# Patient Record
Sex: Female | Born: 1950 | Race: White | Hispanic: No | Marital: Single | Smoking: Current every day smoker
Health system: Southern US, Community
[De-identification: ages and names within clinical notes are randomized; demographics above are authoritative.]

---

## 2017-12-23 ENCOUNTER — Ambulatory Visit (INDEPENDENT_AMBULATORY_CARE_PROVIDER_SITE_OTHER): Payer: Medicare Other | Admitting: Podiatry

## 2017-12-23 ENCOUNTER — Encounter: Payer: Self-pay | Admitting: Podiatry

## 2017-12-23 VITALS — BP 113/76 | HR 92

## 2017-12-23 DIAGNOSIS — B351 Tinea unguium: Secondary | ICD-10-CM | POA: Diagnosis not present

## 2017-12-23 DIAGNOSIS — M79671 Pain in right foot: Secondary | ICD-10-CM | POA: Diagnosis not present

## 2017-12-23 DIAGNOSIS — L600 Ingrowing nail: Secondary | ICD-10-CM | POA: Diagnosis not present

## 2017-12-23 DIAGNOSIS — M79672 Pain in left foot: Secondary | ICD-10-CM | POA: Diagnosis not present

## 2017-12-23 NOTE — Patient Instructions (Signed)
Seen for hypertrophic ingrown nails. All nails debrided. Return in 3 months or sooner if needed.

## 2017-12-23 NOTE — Progress Notes (Signed)
SUBJECTIVE: 67 y.o. year old female presents complaining of painful nails. They are ingrown and hurts to walk. Patient uses a cane for ambulation. Recently moved from [place].  Review of Systems  Constitutional: Negative.   HENT: Negative.   Eyes: Negative.   Respiratory: Negative.   Cardiovascular:       Hx of CHF November 2018.  Gastrointestinal: Negative.   Musculoskeletal: Negative.   Skin: Negative.      OBJECTIVE: DERMATOLOGIC EXAMINATION: Painful ingrown hallucal nails bilateral. No open skin or drainage noted. Thick mycotic nails x 10.  VASCULAR EXAMINATION OF LOWER LIMBS: All pedal pulses are palpable with normal pulsation.  Capillary Filling times within 3 seconds in all digits.  No edema or erythema noted. Temperature gradient from tibial crest to dorsum of foot is within normal bilateral.  NEUROLOGIC EXAMINATION OF THE LOWER LIMBS: All epicritic and tactile sensations grossly intact. Sharp and Dull discriminatory sensations at the plantar ball of hallux is intact bilateral.   MUSCULOSKELETAL EXAMINATION: Rectus foot  ASSESSMENT: Onychocryptosis both great toes. Mycotic nails x 10. Pain in both feet.  PLAN: Reviewed findings and available options. Both feet soaked. All nails debrided.

## 2018-03-17 ENCOUNTER — Ambulatory Visit: Payer: Medicare Other | Admitting: Podiatry

## 2018-04-23 ENCOUNTER — Ambulatory Visit (INDEPENDENT_AMBULATORY_CARE_PROVIDER_SITE_OTHER): Payer: Medicare Other | Admitting: Physical Therapy

## 2018-04-23 ENCOUNTER — Encounter: Payer: Self-pay | Admitting: Physical Therapy

## 2018-04-23 DIAGNOSIS — R2681 Unsteadiness on feet: Secondary | ICD-10-CM

## 2018-04-23 DIAGNOSIS — M6281 Muscle weakness (generalized): Secondary | ICD-10-CM | POA: Diagnosis not present

## 2018-04-23 DIAGNOSIS — R2689 Other abnormalities of gait and mobility: Secondary | ICD-10-CM | POA: Diagnosis present

## 2018-04-23 NOTE — Therapy (Signed)
[place], [place], [address] Phone: [phone]   Fax:  [phone]  Physical Therapy Evaluation  Patient Details  Name: Brittney Cain MRN: [id number] Date of Birth: 25-Jan-1951 Referring Provider (PT): Chipper Oman, MD   Encounter Date: 04/23/2018  PT End of Session - 04/23/18 1504    Visit Number  1    Number of Visits  8    Date for PT Re-Evaluation  06/18/18    Authorization Type  Medicare    PT Start Time  0354    PT Stop Time  1450    PT Time Calculation (min)  48 min    Equipment Utilized During Treatment  Gait belt    Activity Tolerance  Patient tolerated treatment well    Behavior During Therapy  [facility] for tasks assessed/performed       History reviewed. No pertinent past medical history.  History reviewed. No pertinent surgical history.  There were no vitals filed for this visit.   Subjective Assessment - 04/23/18 1406    Subjective  Pt is a 67 y/o female who presents to OPPT with recent dx of progressive MS, but reports decline in function x 2 years.  Pt reports increase in tremors, decreased balance, and difficulty with mobility.      Pertinent History  chronic Lyme    Limitations  Walking    Diagnostic tests  MRI: brain clear; lesions on spinal cord    Patient Stated Goals  balance    Currently in Pain?  Yes    Pain Score  4    up to 7/10; at best 4/10   Pain Location  Back    Pain Orientation  Lower;Mid;Right;Left    Pain Descriptors / Indicators  Stabbing;Cramping    Pain Type  Chronic pain    Pain Radiating Towards  starts in low back then radiates distally, then up into mid back    Pain Onset  More than a month ago    Pain Frequency  Constant    Aggravating Factors   bending, walking    Pain Relieving Factors  sit, chiropractor   will monitor, however will not directly address        [facility] PT Assessment - 04/23/18 1412      Assessment   Medical  Diagnosis  Progressive MS    Referring Provider (PT)  Zeid, Meta Hatchet, MD    Onset Date/Surgical Date  --   decline x 2 years   Hand Dominance  Right    Next MD Visit  6 months    Prior Therapy  none      Precautions   Precautions  Fall      Restrictions   Weight Bearing Restrictions  No      Balance Screen   Has the patient fallen in the past 6 months  Yes    How many times?  1    Has the patient had a decrease in activity level because of a fear of falling?   No    Is the patient reluctant to leave their home because of a fear of falling?   No      Home Environment   Living Environment  Private residence    Living Arrangements  Alone    Type of [place] to enter    Entrance Stairs-Number of Steps  3    Entrance Stairs-Rails  Right  Home Layout  One level    [place] - single point   3 wheeled RW     Prior Function   Level of Independence  Independent with community mobility with device;Independent with basic ADLs    Vocation  Retired    Neurosurgeon    Leisure  spend time with sister, play with dogs; Tai Chi      Cognition   Overall Cognitive Status  Within Functional Limits for tasks assessed      Sensation   Additional Comments  intact      Posture/Postural Control   Posture/Postural Control  Postural limitations    Postural Limitations  Rounded Shoulders;Forward head      ROM / Strength   AROM / PROM / Strength  Strength      Strength   Overall Strength Comments  tested in sitting    Strength Assessment Site  Hip;Knee;Ankle    Right/Left Hip  Right;Left    Right Hip Flexion  3+/5    Left Hip Flexion  4/5    Right/Left Knee  Right;Left    Right Knee Flexion  3+/5    Right Knee Extension  4/5    Left Knee Flexion  4/5    Left Knee Extension  4/5    Right/Left Ankle  Right;Left    Right Ankle Dorsiflexion  4/5    Left Ankle Dorsiflexion  4/5      Flexibility   Soft Tissue Assessment  /Muscle Length  --   tightness bil lower extremities (HS, quads, gastrocs)     Ambulation/Gait   Ambulation/Gait  Yes    Ambulation/Gait Assistance  5: Supervision    Ambulation Distance (Feet)  100 Feet    Assistive device  Straight cane    Gait Pattern  Lateral trunk lean to right;Poor foot clearance - right;Trendelenburg    Ambulation Surface  Level;Indoor    Gait velocity  1.9 ft/sec   16' = 8.41 sec     Standardized Balance Assessment   Standardized Balance Assessment  Berg Balance Test;Timed Up and Go Test      Berg Balance Test   Sit to Stand  Able to stand without using hands and stabilize independently    Standing Unsupported  Able to stand safely 2 minutes    Sitting with Back Unsupported but Feet Supported on Floor or Stool  Able to sit safely and securely 2 minutes    Stand to Sit  Sits safely with minimal use of hands    Transfers  Able to transfer safely, minor use of hands    Standing Unsupported with Eyes Closed  Able to stand 10 seconds with supervision    Standing Ubsupported with Feet Together  Able to place feet together independently and stand 1 minute safely    From Standing, Reach Forward with Outstretched Arm  Can reach forward >12 cm safely (5")    From Standing Position, Pick up Object from Floor  Able to pick up shoe, needs supervision    From Standing Position, Turn to Look Behind Over each Shoulder  Needs supervision when turning    Turn 360 Degrees  Able to turn 360 degrees safely but slowly    Standing Unsupported, Alternately Place Feet on Step/Stool  Able to complete 4 steps without aid or supervision    Standing Unsupported, One Foot in Front  Able to plae foot ahead of the other independently and hold 30 seconds  Standing on One Leg  Tries to lift leg/unable to hold 3 seconds but remains standing independently    Total Score  42      Timed Up and Go Test   Normal TUG (seconds)  17.43    TUG Comments  with SPC                 Objective measurements completed on examination: See above findings.              PT Education - 04/23/18 1504    Education Details  initiated community resources, benefits of AD (info on [employer] for assistance)    Person(s) Educated  Patient    Methods  Explanation;Handout    Comprehension  Verbalized understanding       PT Short Term Goals - 04/23/18 1509      PT SHORT TERM GOAL #1   Title  verbalize understanding of community resources available for MS    Status  New    Target Date  05/21/18      PT SHORT TERM GOAL #2   Title  improve gait velocity to > 2.2 ft/sec for improved mobility    Status  New    Target Date  05/21/18        PT Long Term Goals - 04/23/18 1510      PT LONG TERM GOAL #1   Title  independent with HEP    Status  New    Target Date  06/18/18      PT LONG TERM GOAL #2   Title  improve gait velocity to >/= 2.62 ft/sec for improved community access    Status  New    Target Date  06/18/18      PT LONG TERM GOAL #3   Title  improve BERG balance score to >/= 45/56 for improved balance    Status  New    Target Date  06/18/18      PT LONG TERM GOAL #4   Title  improve timed up and go to < 14 sec for improved balance and mobility    Status  New    Target Date  06/18/18             Plan - 04/23/18 1505    Clinical Impression Statement  Pt is a 67 y/o female who presents to OPPT with new diagnosis of progressive MS.  Pt demonstrates decreased strength, balance, and gait abnormalities affecting functional mobility.  Pt will benefit from PT to address deficits listed.    History and Personal Factors relevant to plan of care:  chronic lyme, COPD, MS    Clinical Presentation  Evolving    Clinical Presentation due to:  new diagnoses    Clinical Decision Making  Moderate    Rehab Potential  Good    PT Frequency  1x / week   recommend 2x/wk but pt requesting 1x/wk   PT Duration  8 weeks    PT  Treatment/Interventions  ADLs/Self Care Home Management;Cryotherapy;Electrical Stimulation;Ultrasound;Moist Heat;Gait training;Stair training;Functional mobility training;DME Instruction;Therapeutic activities;Therapeutic exercise;Balance training;Neuromuscular re-education;Patient/family education;Energy conservation;Taping    PT Next Visit Plan  establish HEP for low level strengthening and balance/stretches to help with spasms    Consulted and Agree with Plan of Care  Patient       Patient will benefit from skilled therapeutic intervention in order to improve the following deficits and impairments:  Abnormal gait, Postural dysfunction, Decreased mobility, Decreased activity tolerance, Decreased endurance, Decreased strength,  Difficulty walking, Decreased balance, Cardiopulmonary status limiting activity  Visit Diagnosis: Other abnormalities of gait and mobility - Plan: PT plan of care cert/re-cert  Unsteadiness on feet - Plan: PT plan of care cert/re-cert  Muscle weakness (generalized) - Plan: PT plan of care cert/re-cert     Problem List There are no active problems to display for this patient.     Laureen Abrahams, PT, DPT 04/23/18 3:13 PM     [facility] [place], [place], [address] Phone: [phone]   Fax:  [phone]  Name: Ilithyia Titzer MRN: [id number] Date of Birth: 12-12-1950

## 2018-04-28 ENCOUNTER — Encounter: Payer: Self-pay | Admitting: Physical Therapy

## 2018-05-08 ENCOUNTER — Encounter: Payer: Self-pay | Admitting: Physical Therapy

## 2018-05-14 ENCOUNTER — Ambulatory Visit (INDEPENDENT_AMBULATORY_CARE_PROVIDER_SITE_OTHER): Payer: Medicare Other | Admitting: Physical Therapy

## 2018-05-14 ENCOUNTER — Encounter: Payer: Self-pay | Admitting: Physical Therapy

## 2018-05-14 DIAGNOSIS — M6281 Muscle weakness (generalized): Secondary | ICD-10-CM

## 2018-05-14 DIAGNOSIS — R2681 Unsteadiness on feet: Secondary | ICD-10-CM

## 2018-05-14 DIAGNOSIS — R2689 Other abnormalities of gait and mobility: Secondary | ICD-10-CM | POA: Diagnosis present

## 2018-05-14 NOTE — Therapy (Signed)
[place], [place], [address] Phone: [phone]   Fax:  [phone]  Physical Therapy Treatment  Patient Details  Name: Brittney Cain MRN: [id number] Date of Birth: October 14, 1950 Referring Provider (PT): Chipper Oman, MD   Encounter Date: 05/14/2018  PT End of Session - 05/14/18 1509    Visit Number  2    Number of Visits  8    Date for PT Re-Evaluation  06/18/18    Authorization Type  Medicare    PT Start Time  1427    PT Stop Time  1507    PT Time Calculation (min)  40 min    Activity Tolerance  Patient tolerated treatment well    Behavior During Therapy  [facility] for tasks assessed/performed       History reviewed. No pertinent past medical history.  History reviewed. No pertinent surgical history.  There were no vitals filed for this visit.  Subjective Assessment - 05/14/18 1428    Subjective  injured her Rt knee a few weeks ago; stood up and had sharp pain.  went to MD and has "torn cartlidge." feeling better but still achey.    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  Yes    Pain Score  --   would not rate   Pain Location  Back    Pain Orientation  Lower;Mid;Left;Right    Pain Descriptors / Indicators  Spasm                       [facility] Adult PT Treatment/Exercise - 05/14/18 1430      Self-Care   Self-Care  Other Self-Care Comments    Other Self-Care Comments   community resources for MS and financial assistance      Exercises   Exercises  Lumbar;Knee/Hip      Lumbar Exercises: Stretches   Single Knee to Chest Stretch  Right;Left;2 reps;30 seconds      Lumbar Exercises: Seated   Long Arc Quad on Chair  Both;10 reps    Sit to Stand  10 reps   min UE support     Lumbar Exercises: Supine   Bridge  10 reps;5 seconds      Knee/Hip Exercises: Aerobic   Nustep  L2 x 5 min; 4  extremities      Knee/Hip Exercises: Standing   Heel Raises  Both;10 reps    Knee Flexion  Both;10 reps   alternating, UE support   Hip Abduction  Both;10 reps;Knee straight   alternating, UE support   Hip Extension  Both;10 reps;Knee straight   alternating, UE support   SLS  3x15 sec bil; intermittent UE support             PT Education - 05/14/18 1508    Education Details  MS society community resources, HEP    Person(s) Educated  Patient    Methods  Explanation;Demonstration;Handout    Comprehension  Verbalized understanding;Returned demonstration;Need further instruction       PT Short Term Goals - 05/14/18 1511      PT SHORT TERM GOAL #1   Title  verbalize understanding of community resources available for MS    Status  Achieved      PT SHORT TERM GOAL #2   Title  improve gait velocity to > 2.2 ft/sec for improved mobility  Status  On-going    Target Date  05/21/18        PT Long Term Goals - 04/23/18 1510      PT LONG TERM GOAL #1   Title  independent with HEP    Status  New    Target Date  06/18/18      PT LONG TERM GOAL #2   Title  improve gait velocity to >/= 2.62 ft/sec for improved community access    Status  New    Target Date  06/18/18      PT LONG TERM GOAL #3   Title  improve BERG balance score to >/= 45/56 for improved balance    Status  New    Target Date  06/18/18      PT LONG TERM GOAL #4   Title  improve timed up and go to < 14 sec for improved balance and mobility    Status  New    Target Date  06/18/18            Plan - 05/14/18 1509    Clinical Impression Statement  Pt tolerated session well today with handouts provided from [employer] for financial assistance as well as fatigue and stretching.  STG #1 met.      Rehab Potential  Good    PT Frequency  1x / week   recommend 2x/wk but pt requesting 1x/wk   PT Duration  8 weeks    PT Treatment/Interventions  ADLs/Self Care Home Management;Cryotherapy;Electrical  Stimulation;Ultrasound;Moist Heat;Gait training;Stair training;Functional mobility training;DME Instruction;Therapeutic activities;Therapeutic exercise;Balance training;Neuromuscular re-education;Patient/family education;Energy conservation;Taping    PT Next Visit Plan  review HEP, continue strengthening/stretching and gentle endurance    PT Home Exercise Plan  Access Code: [email or web address]    Consulted and Agree with Plan of Care  Patient       Patient will benefit from skilled therapeutic intervention in order to improve the following deficits and impairments:  Abnormal gait, Postural dysfunction, Decreased mobility, Decreased activity tolerance, Decreased endurance, Decreased strength, Difficulty walking, Decreased balance, Cardiopulmonary status limiting activity  Visit Diagnosis: Other abnormalities of gait and mobility  Unsteadiness on feet  Muscle weakness (generalized)     Problem List There are no active problems to display for this patient.     Laureen Abrahams, PT, DPT 05/14/18 3:12 PM     [facility] [place], [place], [address] Phone: [phone]   Fax:  [phone]  Name: Brittney Cain MRN: [id number] Date of Birth: May 28, 1951

## 2018-05-14 NOTE — Patient Instructions (Signed)
Access Code: [facility]  URL: [email or web address]  Date: 05/14/2018  Prepared by: Moshe Cipro   Exercises  Hooklying Single Knee to Chest - 2 reps - 1 sets - 30 sec hold - 2x daily - 7x weekly  Supine Lower Trunk Rotation - 3 reps - 1 sets - 30 sec hold - 2x daily - 7x weekly  Supine Bridge - 10 reps - 1 sets - 5 sec hold - 1x daily - 7x weekly  Sit to Stand - 10 reps - 1 sets - 1x daily - 7x weekly  Seated Long Arc Quad - 10 reps - 1 sets - 3 sec hold - 1x daily - 7x weekly  Standing Hip Extension with Counter Support - 20 reps - 1 sets - 1x daily - 7x weekly  Standing Knee Flexion with Counter Support - 20 reps - 1 sets - 1x daily - 7x weekly  Standing Hip Abduction with Counter Support - 20 reps - 1 sets - 1x daily - 7x weekly

## 2018-05-22 ENCOUNTER — Encounter: Payer: Medicare Other | Admitting: Physical Therapy

## 2018-05-22 ENCOUNTER — Telehealth: Payer: Self-pay | Admitting: Physical Therapy

## 2018-05-22 NOTE — Telephone Encounter (Signed)
Called pt due to no show for 2:00 appt.  Pt thought appt was at 2:30, but also reported she was having trouble getting out of the house today.  Added new appt for next week.  Clarita CraneStephanie F Jerrion Tabbert, PT, DPT 05/22/18 2:42 PM

## 2018-05-29 ENCOUNTER — Encounter: Payer: Self-pay | Admitting: Physical Therapy

## 2018-05-29 ENCOUNTER — Ambulatory Visit (INDEPENDENT_AMBULATORY_CARE_PROVIDER_SITE_OTHER): Payer: Medicare Other | Admitting: Physical Therapy

## 2018-05-29 DIAGNOSIS — M6281 Muscle weakness (generalized): Secondary | ICD-10-CM | POA: Diagnosis not present

## 2018-05-29 DIAGNOSIS — R2689 Other abnormalities of gait and mobility: Secondary | ICD-10-CM | POA: Diagnosis not present

## 2018-05-29 DIAGNOSIS — R2681 Unsteadiness on feet: Secondary | ICD-10-CM | POA: Diagnosis not present

## 2018-05-29 NOTE — Therapy (Signed)
[facility] [address] [place], [place], [address] Phone: [phone]   Fax:  [phone]  Physical Therapy Treatment  Patient Details  Name: Brittney Cain MRN: [id number] Date of Birth: 12/03/1950 Referring Provider (PT): Aretha ParrotZeid, Nuhad Abou, MD   Encounter Date: 05/29/2018  PT End of Session - 05/29/18 1524    Visit Number  3    Number of Visits  8    Date for PT Re-Evaluation  06/18/18    Authorization Type  Medicare    PT Start Time  1445    PT Stop Time  1520    PT Time Calculation (min)  35 min    Activity Tolerance  Patient limited by fatigue    Behavior During Therapy  [facility] for tasks assessed/performed       History reviewed. No pertinent past medical history.  History reviewed. No pertinent surgical history.  There were no vitals filed for this visit.  Subjective Assessment - 05/29/18 1448    Subjective  not feeling well today.  back is hurting.  balance feels off when turning head.    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  Yes    Pain Score  6     Pain Location  Back    Pain Orientation  Lower;Mid;Left;Right    Pain Descriptors / Indicators  Spasm    Pain Type  Chronic pain    Pain Onset  More than a month ago    Aggravating Factors   bending, walking    Pain Relieving Factors  sit, chiropractor                       [facility] Adult PT Treatment/Exercise - 05/29/18 1451      Exercises   Exercises  Lumbar;Knee/Hip      Lumbar Exercises: Stretches   Single Knee to Chest Stretch  Right;Left;2 reps;30 seconds    Lower Trunk Rotation  3 reps;30 seconds      Knee/Hip Exercises: Standing   Heel Raises  Both;10 reps    Knee Flexion  Both;10 reps   alternating, UE support   Hip Abduction  Both;10 reps;Knee straight   alternating, UE support   Hip Extension  Both;10 reps;Knee straight   alternating, UE  support     Knee/Hip Exercises: Seated   Long Arc Quad  Both;10 reps    Ball Squeeze  10 x 5 sec    Clamshell with TheraBand  Blue   x10 reps   Marching  Both;10 reps   blue theraband   Sit to [employer]  10 reps;with UE support               PT Short Term Goals - 05/14/18 1511      PT SHORT TERM GOAL #1   Title  verbalize understanding of community resources available for MS    Status  Achieved      PT SHORT TERM GOAL #2   Title  improve gait velocity to > 2.2 ft/sec for improved mobility    Status  On-going    Target Date  05/21/18        PT Long Term Goals - 04/23/18 1510      PT LONG TERM GOAL #1   Title  independent with HEP    Status  New    Target Date  06/18/18  PT LONG TERM GOAL #2   Title  improve gait velocity to >/= 2.62 ft/sec for improved community access    Status  New    Target Date  06/18/18      PT LONG TERM GOAL #3   Title  improve BERG balance score to >/= 45/56 for improved balance    Status  New    Target Date  06/18/18      PT LONG TERM GOAL #4   Title  improve timed up and go to < 14 sec for improved balance and mobility    Status  New    Target Date  06/18/18            Plan - 05/29/18 1525    Clinical Impression Statement  Pt limited by episode of fatigue and tremor today needing ~ 5-7 min rest break and needed to end session early today.  Slowly progressing with PT at this time.  Progress limited by attendance at this time, but independent with current HEP.    Rehab Potential  Good    PT Frequency  1x / week   recommend 2x/wk but pt requesting 1x/wk   PT Duration  8 weeks    PT Treatment/Interventions  ADLs/Self Care Home Management;Cryotherapy;Electrical Stimulation;Ultrasound;Moist Heat;Gait training;Stair training;Functional mobility training;DME Instruction;Therapeutic activities;Therapeutic exercise;Balance training;Neuromuscular re-education;Patient/family education;Energy conservation;Taping    PT Next Visit Plan   review HEP, continue strengthening/stretching and gentle endurance    PT Home Exercise Plan  Access Code: [email or web address]    Consulted and Agree with Plan of Care  Patient       Patient will benefit from skilled therapeutic intervention in order to improve the following deficits and impairments:  Abnormal gait, Postural dysfunction, Decreased mobility, Decreased activity tolerance, Decreased endurance, Decreased strength, Difficulty walking, Decreased balance, Cardiopulmonary status limiting activity  Visit Diagnosis: Other abnormalities of gait and mobility  Unsteadiness on feet  Muscle weakness (generalized)     Problem List There are no active problems to display for this patient.     Clarita Crane, PT, DPT 05/29/18 3:26 PM     [facility] [address] [place], [place], [address] Phone: [phone]   Fax:  [phone]  Name: Brittney Cain MRN: [id number] Date of Birth: [birth year]

## 2018-06-11 ENCOUNTER — Encounter: Payer: Self-pay | Admitting: Physical Therapy

## 2018-06-11 ENCOUNTER — Ambulatory Visit (INDEPENDENT_AMBULATORY_CARE_PROVIDER_SITE_OTHER): Payer: Medicare Other | Admitting: Physical Therapy

## 2018-06-11 DIAGNOSIS — M6281 Muscle weakness (generalized): Secondary | ICD-10-CM

## 2018-06-11 DIAGNOSIS — R2689 Other abnormalities of gait and mobility: Secondary | ICD-10-CM

## 2018-06-11 DIAGNOSIS — R2681 Unsteadiness on feet: Secondary | ICD-10-CM | POA: Diagnosis not present

## 2018-06-11 NOTE — Therapy (Signed)
[place], [place], [address] Phone: [phone]   Fax:  [phone]  Physical Therapy Treatment  Patient Details  Name: Brittney Cain MRN: [id number] Date of Birth: 03/14/1951 Referring Provider (PT): Chipper Oman, MD   Encounter Date: 06/11/2018  PT End of Session - 06/11/18 1557    Visit Number  4    Number of Visits  8    Date for PT Re-Evaluation  06/18/18    Authorization Type  Medicare    PT Start Time  3202    PT Stop Time  1554    PT Time Calculation (min)  39 min    Activity Tolerance  Patient limited by fatigue    Behavior During Therapy  [facility] for tasks assessed/performed       History reviewed. No pertinent past medical history.  History reviewed. No pertinent surgical history.  There were no vitals filed for this visit.  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  Yes    Pain Score  4     Pain Location  --   Lt ankle, knee, back   Pain Orientation  Left    Pain Descriptors / Indicators  Spasm    Pain Type  Chronic pain    Pain Onset  More than a month ago    Pain Frequency  Constant    Aggravating Factors   bending, walking    Pain Relieving Factors  sit, chiropractor         [facility] PT Assessment - 06/11/18 1521      Assessment   Medical Diagnosis  Progressive MS    Referring Provider (PT)  Chipper Oman, MD      Ambulation/Gait   Gait velocity  1.63 ft/sec   17' = 10.42 sec                  [facility] Adult PT Treatment/Exercise - 06/11/18 1521      Knee/Hip Exercises: Aerobic   Nustep  L3 x 6 min; 4 extremities          Balance Exercises - 06/11/18 1534      Balance Exercises: Standing   Standing Eyes Opened  Solid surface;Narrow base of support  (BOS);Foam/compliant surface;Wide (BOA);Head turns    Standing Eyes Closed  Solid surface;Narrow base of support (BOS);5 reps;10 secs          PT Short Term Goals - 06/11/18 1557      PT SHORT TERM GOAL #1   Title  verbalize understanding of community resources available for MS    Status  Achieved      PT SHORT TERM GOAL #2   Title  improve gait velocity to > 2.2 ft/sec for improved mobility    Baseline  slight decrease    Status  Not Met        PT Long Term Goals - 04/23/18 1510      PT LONG TERM GOAL #1   Title  independent with HEP    Status  New    Target Date  06/18/18      PT LONG TERM GOAL #2   Title  improve gait velocity to >/= 2.62 ft/sec for improved community access  Status  New    Target Date  06/18/18      PT LONG TERM GOAL #3   Title  improve BERG balance score to >/= 45/56 for improved balance    Status  New    Target Date  06/18/18      PT LONG TERM GOAL #4   Title  improve timed up and go to < 14 sec for improved balance and mobility    Status  New    Target Date  06/18/18            Plan - 06/11/18 1557    Clinical Impression Statement  Pt tolerated session well today and added balance exercises to HEP today.  Pt feels she's mildly stronger since initial eval, and feels she's doing well with PT.  Wants to continue to attend PT 1x/wk to continue towards LTGs.  Will plan to assess and recert next visit.    Rehab Potential  Good    PT Frequency  1x / week   recommend 2x/wk but pt requesting 1x/wk   PT Duration  8 weeks    PT Treatment/Interventions  ADLs/Self Care Home Management;Cryotherapy;Electrical Stimulation;Ultrasound;Moist Heat;Gait training;Stair training;Functional mobility training;DME Instruction;Therapeutic activities;Therapeutic exercise;Balance training;Neuromuscular re-education;Patient/family education;Energy conservation;Taping    PT Next Visit Plan  review HEP, continue strengthening/stretching and gentle endurance; needs  recert    PT Home Exercise Plan  Access Code: [email or web address]    Consulted and Agree with Plan of Care  Patient       Patient will benefit from skilled therapeutic intervention in order to improve the following deficits and impairments:  Abnormal gait, Postural dysfunction, Decreased mobility, Decreased activity tolerance, Decreased endurance, Decreased strength, Difficulty walking, Decreased balance, Cardiopulmonary status limiting activity  Visit Diagnosis: Other abnormalities of gait and mobility  Unsteadiness on feet  Muscle weakness (generalized)     Problem List There are no active problems to display for this patient.     Laureen Abrahams, PT, DPT 06/11/18 4:00 PM      [facility] [place], [place], [address] Phone: [phone]   Fax:  [phone]  Name: Brittney Cain MRN: [id number] Date of Birth: June 28, 1951

## 2018-06-11 NOTE — Patient Instructions (Signed)
Access Code: [email or web address]  URL: [email or web address]  Date: 06/11/2018  Prepared by: Moshe CiproStephanie Antonio Creswell   Exercises  Hooklying Single Knee to Chest - 2 reps - 1 sets - 30 sec hold - 2x daily - 7x weekly  Supine Lower Trunk Rotation - 3 reps - 1 sets - 30 sec hold - 2x daily - 7x weekly  Supine Bridge - 10 reps - 1 sets - 5 sec hold - 1x daily - 7x weekly  Sit to Stand - 10 reps - 1 sets - 1x daily - 7x weekly  Seated Long Arc Quad - 10 reps - 1 sets - 3 sec hold - 1x daily - 7x weekly  Standing Hip Extension with Counter Support - 20 reps - 1 sets - 1x daily - 7x weekly  Standing Knee Flexion with Counter Support - 20 reps - 1 sets - 1x daily - 7x weekly  Standing Hip Abduction with Counter Support - 20 reps - 1 sets - 1x daily - 7x weekly  Romberg Stance with Head Nods - 10 reps - 1 sets - 1x daily - 7x weekly  Romberg Stance Eyes Closed on Foam Pad - 5 reps - 1 sets - 10-15 sec hold - 2x daily - 7x weekly

## 2018-06-18 ENCOUNTER — Encounter: Payer: Self-pay | Admitting: Physical Therapy

## 2018-06-18 ENCOUNTER — Ambulatory Visit (INDEPENDENT_AMBULATORY_CARE_PROVIDER_SITE_OTHER): Payer: Medicare Other | Admitting: Physical Therapy

## 2018-06-18 DIAGNOSIS — R2681 Unsteadiness on feet: Secondary | ICD-10-CM

## 2018-06-18 DIAGNOSIS — R2689 Other abnormalities of gait and mobility: Secondary | ICD-10-CM

## 2018-06-18 DIAGNOSIS — M6281 Muscle weakness (generalized): Secondary | ICD-10-CM | POA: Diagnosis not present

## 2018-06-18 NOTE — Therapy (Signed)
[place], [place], [address] Phone: [phone]   Fax:  [phone]  Physical Therapy Treatment  Patient Details  Name: Brittney Cain MRN: [id number] Date of Birth: 31-May-1951 Referring Provider (PT): Chipper Oman, MD   Encounter Date: 06/18/2018  PT End of Session - 06/18/18 1453    Visit Number  5    Number of Visits  9    Date for PT Re-Evaluation  07/16/18    Authorization Type  Medicare    PT Start Time  8242    PT Stop Time  1445    PT Time Calculation (min)  40 min    Activity Tolerance  Patient limited by fatigue;Patient tolerated treatment well    Behavior During Therapy  [facility] for tasks assessed/performed       History reviewed. No pertinent past medical history.  History reviewed. No pertinent surgical history.  There were no vitals filed for this visit.  Subjective Assessment - 06/18/18 1405    Subjective  "not so good today" feels tired and woozy, but willing to work.    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  Yes    Pain Score  5     Pain Location  Neck    Pain Orientation  Left    Pain Descriptors / Indicators  Spasm    Pain Type  Chronic pain    Pain Onset  More than a month ago    Pain Frequency  Constant    Aggravating Factors   bending, walking    Pain Relieving Factors  sit, chiropractor         [facility] PT Assessment - 06/18/18 1501      Assessment   Medical Diagnosis  Progressive MS    Referring Provider (PT)  Chipper Oman, MD      Ambulation/Gait   Gait velocity  1.63 ft/sec   17' = 10.42 sec                  [facility] Adult PT Treatment/Exercise - 06/18/18 1409      Exercises   Exercises  Shoulder;Neck;Elbow;Knee/Hip      Elbow Exercises   Elbow Flexion  Both;10 reps;Seated;Theraband    Theraband Level (Elbow Flexion)  Level 2 (Red)    Elbow Extension   Both;10 reps;Seated;Theraband    Theraband Level (Elbow Extension)  Level 2 (Red)      Neck Exercises: Seated   Shoulder Rolls  Backwards;10 reps      Lumbar Exercises: Stretches   Lower Trunk Rotation  3 reps;20 seconds   seated   Standing Extension Limitations  attempted with minimal benefit    Other Lumbar Stretch Exercise  seated mid back stretch with red physioball 3x10 sec each direction      Knee/Hip Exercises: Seated   Long Arc Quad  Both;10 reps;Weights    Long Arc Quad Weight  2 lbs.    Ball Squeeze  10 x 5 sec    Other Seated Knee/Hip Exercises  heel/toe raises x 10    Marching  Both;10 reps;Weights    Marching Weights  2 lbs.    Hamstring Curl  Both;10 reps   red theraband     Shoulder Exercises: Seated   Row  Both;10 reps;Theraband    Theraband Level (Shoulder Row)  Level 2 (Red)    Horizontal ABduction  Both;10 reps;Theraband    Theraband Level (Shoulder Horizontal ABduction)  Level 2 (Red)    External Rotation  Both;10 reps;Theraband    Theraband Level (Shoulder External Rotation)  Level 2 (Red)      Neck Exercises: Stretches   Upper Trapezius Stretch  Right;Left;3 reps;30 seconds    Levator Stretch  Right;Left;3 reps;30 seconds             PT Education - 06/18/18 1453    Education Details  HEP    Person(s) Educated  Patient    Methods  Explanation    Comprehension  Verbalized understanding       PT Short Term Goals - 06/11/18 1557      PT SHORT TERM GOAL #1   Title  verbalize understanding of community resources available for MS    Status  Achieved      PT SHORT TERM GOAL #2   Title  improve gait velocity to > 2.2 ft/sec for improved mobility    Baseline  slight decrease    Status  Not Met        PT Long Term Goals - 06/18/18 1456      PT LONG TERM GOAL #1   Title  independent with HEP    Status  On-going    Target Date  07/16/18      PT LONG TERM GOAL #2   Title  improve gait velocity to >/= 2.62 ft/sec for improved community  access    Status  On-going    Target Date  07/16/18      PT LONG TERM GOAL #3   Title  improve BERG balance score to >/= 45/56 for improved balance    Status  On-going    Target Date  07/16/18      PT LONG TERM GOAL #4   Title  improve timed up and go to < 14 sec for improved balance and mobility    Status  On-going    Target Date  07/16/18            Plan - 06/18/18 1456    Clinical Impression Statement  Pt arrived to PT today with c/o feeling weak and off balance.  Performed seated exercises today for strengthening and stretching.  Pt tolerated session well today.  Pt with inconsistent attendance initially with PT, with improvement in attendance x 2-3 weeks.  Recommend continued PT 1x/wk x 4 wks to progress towards LTGs.  Goals not met due to limited compliance at this time.    PT Frequency  1x / week    PT Duration  4 weeks    PT Treatment/Interventions  ADLs/Self Care Home Management;Cryotherapy;Electrical Stimulation;Ultrasound;Moist Heat;Gait training;Stair training;Functional mobility training;DME Instruction;Therapeutic activities;Therapeutic exercise;Balance training;Neuromuscular re-education;Patient/family education;Energy conservation;Taping    PT Next Visit Plan  review HEP, continue strengthening/stretching and gentle endurance    PT Home Exercise Plan  Access Code: [email or web address]    Consulted and Agree with Plan of Care  Patient       Patient will benefit from skilled therapeutic intervention in order to improve the following deficits and impairments:  Abnormal gait, Postural dysfunction, Decreased mobility, Decreased activity tolerance, Decreased endurance, Decreased strength, Difficulty walking, Decreased balance, Cardiopulmonary status limiting activity  Visit Diagnosis: Other abnormalities of gait and mobility - Plan: PT plan of care cert/re-cert  Unsteadiness on feet - Plan: PT plan of care cert/re-cert  Muscle weakness (generalized) - Plan: PT plan of care  cert/re-cert     Problem List There are  no active problems to display for this patient.     Laureen Abrahams, PT, DPT 06/18/18 3:02 PM     [facility] [place], [place], [address] Phone: [phone]   Fax:  [phone]  Name: Crickett Abbett MRN: [id number] Date of Birth: 01/26/1951

## 2018-06-18 NOTE — Patient Instructions (Signed)
[email or web address]  URL: [email or web address]  Date: 06/18/2018  Prepared by: Moshe CiproStephanie Tuleen Mandelbaum   Exercises  Hooklying Single Knee to Chest - 2 reps - 1 sets - 30 sec hold - 2x daily - 7x weekly  Supine Lower Trunk Rotation - 3 reps - 1 sets - 30 sec hold - 2x daily - 7x weekly  Supine Bridge - 10 reps - 1 sets - 5 sec hold - 1x daily - 7x weekly  Sit to Stand - 10 reps - 1 sets - 1x daily - 7x weekly  Seated Long Arc Quad - 10 reps - 1 sets - 3 sec hold - 1x daily - 7x weekly  Standing Hip Extension with Counter Support - 20 reps - 1 sets - 1x daily - 7x weekly  Standing Knee Flexion with Counter Support - 20 reps - 1 sets - 1x daily - 7x weekly  Standing Hip Abduction with Counter Support - 20 reps - 1 sets - 1x daily - 7x weekly  Romberg Stance with Head Nods - 10 reps - 1 sets - 1x daily - 7x weekly  Romberg Stance Eyes Closed on Foam Pad - 5 reps - 1 sets - 10-15 sec hold - 2x daily - 7x weekly  Seated Shoulder Horizontal Abduction with Resistance - 10 reps - 1 sets - 1x daily - 7x weekly  Seated Bilateral Shoulder External Rotation with Resistance - 10 reps - 1 sets - 1x daily - 7x weekly  Seated Elbow Flexion with Resistance - 10 reps - 1 sets - 1x daily - 7x weekly  Seated Elbow Extension with Self-Anchored Resistance - 10 reps - 1 sets - 1x daily - 7x weekly  Seated Scapular Retraction with Resistance - 10 reps - 1 sets - 1x daily - 7x weekly

## 2018-06-25 ENCOUNTER — Encounter: Payer: Medicare Other | Admitting: Physical Therapy

## 2018-07-07 ENCOUNTER — Telehealth: Payer: Self-pay | Admitting: Physical Therapy

## 2018-07-07 ENCOUNTER — Encounter: Payer: Medicare Other | Admitting: Physical Therapy

## 2018-07-07 NOTE — Telephone Encounter (Signed)
Left message for pt due to no show for PT appt.  Advised to call back to schedule more PT appts as this is last scheduled appt.  Clarita CraneStephanie F Bracken Moffa, PT, DPT 07/07/18 2:50 PM

## 2018-07-17 ENCOUNTER — Ambulatory Visit (INDEPENDENT_AMBULATORY_CARE_PROVIDER_SITE_OTHER): Payer: Medicare Other | Admitting: Physical Therapy

## 2018-07-17 ENCOUNTER — Encounter: Payer: Self-pay | Admitting: Physical Therapy

## 2018-07-17 DIAGNOSIS — R2681 Unsteadiness on feet: Secondary | ICD-10-CM | POA: Diagnosis not present

## 2018-07-17 DIAGNOSIS — R2689 Other abnormalities of gait and mobility: Secondary | ICD-10-CM | POA: Diagnosis present

## 2018-07-17 DIAGNOSIS — M6281 Muscle weakness (generalized): Secondary | ICD-10-CM

## 2018-07-17 NOTE — Patient Instructions (Signed)
Access Code: [facility]  URL: [email or web address]  Date: 07/17/2018  Prepared by: Moshe Cipro   Exercises  Hooklying Single Knee to Chest - 2 reps - 1 sets - 30 sec hold - 2x daily - 7x weekly  Supine Lower Trunk Rotation - 3 reps - 1 sets - 30 sec hold - 2x daily - 7x weekly  Supine Bridge - 10 reps - 1 sets - 5 sec hold - 1x daily - 7x weekly  Sit to Stand - 10 reps - 1 sets - 1x daily - 7x weekly  Seated Long Arc Quad - 10 reps - 1 sets - 3 sec hold - 1x daily - 7x weekly  Standing Hip Extension with Counter Support - 20 reps - 1 sets - 1x daily - 7x weekly  Standing Knee Flexion with Counter Support - 20 reps - 1 sets - 1x daily - 7x weekly  Standing Hip Abduction with Counter Support - 20 reps - 1 sets - 1x daily - 7x weekly  Romberg Stance with Head Nods - 10 reps - 1 sets - 1x daily - 7x weekly  Romberg Stance Eyes Closed on Foam Pad - 5 reps - 1 sets - 10-15 sec hold - 2x daily - 7x weekly  Seated Shoulder Horizontal Abduction with Resistance - 10 reps - 1 sets - 1x daily - 7x weekly  Seated Bilateral Shoulder External Rotation with Resistance - 10 reps - 1 sets - 1x daily - 7x weekly  Seated Elbow Flexion with Resistance - 10 reps - 1 sets - 1x daily - 7x weekly  Seated Elbow Extension with Self-Anchored Resistance - 10 reps - 1 sets - 1x daily - 7x weekly  Seated Scapular Retraction with Resistance - 10 reps - 1 sets - 1x daily - 7x weekly  Seated Gaze Stabilization with Head Nod - 1 sets - 30 Seconds - 2x daily - 7x weekly  Seated Gaze Stabilization with Head Rotation - 1 sets - 30 seconds - 2x daily - 7x weekly

## 2018-07-17 NOTE — Therapy (Signed)
[place], [place], [address] Phone: [phone]   Fax:  [phone]  Physical Therapy Treatment  Patient Details  Name: Brittney Cain MRN: [id number] Date of Birth: Sep 13, 1950 Referring Provider (PT): Chipper Oman, MD   Encounter Date: 07/17/2018  PT End of Session - 07/17/18 1547    Visit Number  6    Number of Visits  9    Date for PT Re-Evaluation  08/07/18   extended due to missed weeks   Authorization Type  Medicare    PT Start Time  3235    PT Stop Time  1526    PT Time Calculation (min)  39 min    Activity Tolerance  Patient limited by fatigue;Patient tolerated treatment well    Behavior During Therapy  [facility] for tasks assessed/performed       History reviewed. No pertinent past medical history.  History reviewed. No pertinent surgical history.  There were no vitals filed for this visit.  Subjective Assessment - 07/17/18 1451    Subjective  had bad tremors over the holiday which made her very sore.  "I'm still sore." still having cycles of tremors.    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  Yes    Pain Location  Generalized   back, neck, knees, shoulder   Pain Orientation  Right;Left    Pain Descriptors / Indicators  Sore;Spasm    Pain Type  Chronic pain    Pain Onset  More than a month ago    Pain Frequency  Constant    Aggravating Factors   walking, standing    Pain Relieving Factors  sit, chiropractor                       [facility] Adult PT Treatment/Exercise - 07/17/18 1506      Knee/Hip Exercises: Aerobic   Nustep  L3 x 6 min; 4 extremities      Vestibular Treatment/Exercise - 07/17/18 1539      Vestibular Treatment/Exercise   Vestibular Treatment Provided  Gaze    Gaze Exercises  X1 Viewing Horizontal;X1 Viewing Vertical      X1 Viewing Horizontal   Foot Position   seated    Time  --   30 sec   Reps  1    Comments  min cues with mild increase in sypmtoms      X1 Viewing Vertical   Foot Position  seated    Time  --   10 sec   Reps  1    Comments  attempted 30 sec; pt unable to tolerate with increased nausea         Balance Exercises - 07/17/18 1501      OTAGO PROGRAM   Head Movements  Sitting;5 reps    Neck Movements  Sitting;5 reps    Back Extension  Sitting;5 reps    Trunk Movements  Sitting;5 reps    Ankle Movements  Sitting;10 reps    Knee Extensor  10 reps    Ankle Plantorflexors  20 reps, support   10 reps   Ankle Dorsiflexors  20 reps, support   10 reps   Knee Bends  10 reps, support    Backwards Walking  Support    Sideways Walking  Assistive device    Tandem Stance  10 seconds, support  Tandem Walk  Support        PT Education - 07/17/18 1541    Education Details  gaze stab    Person(s) Educated  Patient    Methods  Explanation;Demonstration;Handout    Comprehension  Verbalized understanding;Returned demonstration       PT Short Term Goals - 06/11/18 1557      PT SHORT TERM GOAL #1   Title  verbalize understanding of community resources available for MS    Status  Achieved      PT SHORT TERM GOAL #2   Title  improve gait velocity to > 2.2 ft/sec for improved mobility    Baseline  slight decrease    Status  Not Met        PT Long Term Goals - 07/17/18 1552      PT LONG TERM GOAL #1   Title  independent with HEP    Status  On-going    Target Date  08/07/18      PT LONG TERM GOAL #2   Title  improve gait velocity to >/= 2.62 ft/sec for improved community access    Status  On-going    Target Date  08/07/18      PT LONG TERM GOAL #3   Title  improve BERG balance score to >/= 45/56 for improved balance    Status  On-going    Target Date  08/07/18      PT LONG TERM GOAL #4   Title  improve timed up and go to < 14 sec for improved balance and mobility    Status  On-going    Target Date  08/07/18             Plan - 07/17/18 1552    Clinical Impression Statement  Pt returns after missing 3 weeks of PT due to holidays and other appt conflicts.  Pt continues to demonstrate decreased endurance and activity tolerance needing frequent rest breaks.  Pt reported dizziness with head turns today so provided gaze stabilization to HEP.  Will continue to benefit from PT to address deficits listed.    PT Frequency  1x / week    PT Duration  4 weeks    PT Treatment/Interventions  ADLs/Self Care Home Management;Cryotherapy;Electrical Stimulation;Ultrasound;Moist Heat;Gait training;Stair training;Functional mobility training;DME Instruction;Therapeutic activities;Therapeutic exercise;Balance training;Neuromuscular re-education;Patient/family education;Energy conservation;Taping    PT Next Visit Plan  continue strengthening/stretching and gentle endurance, balance and gaze activities    PT Home Exercise Plan  Access Code: [email or web address]    Consulted and Agree with Plan of Care  Patient       Patient will benefit from skilled therapeutic intervention in order to improve the following deficits and impairments:  Abnormal gait, Postural dysfunction, Decreased mobility, Decreased activity tolerance, Decreased endurance, Decreased strength, Difficulty walking, Decreased balance, Cardiopulmonary status limiting activity  Visit Diagnosis: Other abnormalities of gait and mobility  Unsteadiness on feet  Muscle weakness (generalized)     Problem List There are no active problems to display for this patient.     Laureen Abrahams, PT, DPT 07/17/18 3:55 PM     [facility] [place], [place], [address] Phone: [phone]   Fax:  [phone]  Name: Brittney Cain MRN: [id number] Date of Birth: 1951/02/05

## 2018-07-24 ENCOUNTER — Encounter: Payer: Self-pay | Admitting: Physical Therapy

## 2018-07-24 ENCOUNTER — Ambulatory Visit (INDEPENDENT_AMBULATORY_CARE_PROVIDER_SITE_OTHER): Payer: Medicare Other | Admitting: Physical Therapy

## 2018-07-24 DIAGNOSIS — R2689 Other abnormalities of gait and mobility: Secondary | ICD-10-CM

## 2018-07-24 DIAGNOSIS — R2681 Unsteadiness on feet: Secondary | ICD-10-CM | POA: Diagnosis not present

## 2018-07-24 DIAGNOSIS — M6281 Muscle weakness (generalized): Secondary | ICD-10-CM | POA: Diagnosis not present

## 2018-07-24 NOTE — Therapy (Signed)
[place], [place], [address] Phone: [phone]   Fax:  [phone]  Physical Therapy Treatment  Patient Details  Name: Brittney Cain MRN: [id number] Date of Birth: September 25, 1950 Referring Provider (PT): Chipper Oman, MD   Encounter Date: 07/24/2018  PT End of Session - 07/24/18 1603    Visit Number  7    Number of Visits  9    Date for PT Re-Evaluation  08/07/18   extended due to missed weeks   Authorization Type  Medicare    PT Start Time  1503    PT Stop Time  1546    PT Time Calculation (min)  43 min    Activity Tolerance  Patient limited by fatigue;Patient tolerated treatment well    Behavior During Therapy  [facility] for tasks assessed/performed       History reviewed. No pertinent past medical history.  History reviewed. No pertinent surgical history.  There were no vitals filed for this visit.  Subjective Assessment - 07/24/18 1507    Subjective  feeling "lousy" today.  stopped side stepping exercise at home due to increase in sciatica flare up.  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  Yes    Pain Score  6    neck, back, hips   Pain Location  Generalized    Pain Orientation  Right;Left    Pain Descriptors / Indicators  Sore;Spasm    Pain Type  Chronic pain    Pain Onset  More than a month ago    Pain Frequency  Constant    Aggravating Factors   walking, standing    Pain Relieving Factors  sit, chiropractor                       [facility] Adult PT Treatment/Exercise - 07/24/18 1509      Lumbar Exercises: Aerobic   Nustep  L4 x 8 min; PT present to discuss progress      Manual Therapy   Manual Therapy  Soft tissue mobilization    Manual therapy comments  skilled palpation and monitoring of  soft tissue during DN    Soft tissue mobilization  Lt upper trap, levator scapula, scalenes, cervical paraspinals      Neck Exercises: Stretches   Upper Trapezius Stretch  Right;Left;3 reps;30 seconds    Other Neck Stretches  attempted scalene stretch; unable due to limited neck ROM       Trigger Point Dry Needling - 07/24/18 1602    Consent Given?  Yes    Education Handout Provided  Yes   will provide next session, verbally reviewed   Muscles Treated Upper Body  Upper trapezius;Supraspinatus    Upper Trapezius Response  Twitch reponse elicited;Palpable increased muscle length    Supraspinatus Response  Twitch response elicited;Palpable increased muscle length      Moist heat applied to cervical area during nustep.     PT Education - 07/24/18 1602    Education Details  DN    Person(s) Educated  Patient    Methods  Explanation   will provide handout next visit   Comprehension  Verbalized understanding       PT Short Term Goals - 06/11/18 1557  PT SHORT TERM GOAL #1   Title  verbalize understanding of community resources available for MS    Status  Achieved      PT SHORT TERM GOAL #2   Title  improve gait velocity to > 2.2 ft/sec for improved mobility    Baseline  slight decrease    Status  Not Met        PT Long Term Goals - 07/17/18 1552      PT LONG TERM GOAL #1   Title  independent with HEP    Status  On-going    Target Date  08/07/18      PT LONG TERM GOAL #2   Title  improve gait velocity to >/= 2.62 ft/sec for improved community access    Status  On-going    Target Date  08/07/18      PT LONG TERM GOAL #3   Title  improve BERG balance score to >/= 45/56 for improved balance    Status  On-going    Target Date  08/07/18      PT LONG TERM GOAL #4   Title  improve timed up and go to < 14 sec for improved balance and mobility    Status  On-going    Target Date  08/07/18            Plan - 07/24/18 1603    Clinical Impression Statement  Pt  with positive response to DN and manual therapy today with decreased tightness following session.  Discussed possibility of DN biweekly to help with tissue extensibility and decrease pain.  Will continue to benefit from PT to maximize function.    PT Frequency  1x / week    PT Duration  4 weeks    PT Treatment/Interventions  ADLs/Self Care Home Management;Cryotherapy;Electrical Stimulation;Ultrasound;Moist Heat;Gait training;Stair training;Functional mobility training;DME Instruction;Therapeutic activities;Therapeutic exercise;Balance training;Neuromuscular re-education;Patient/family education;Energy conservation;Taping    PT Next Visit Plan  continue strengthening/stretching and gentle endurance, balance and gaze activities    PT Home Exercise Plan  Access Code: [email or web address]    Consulted and Agree with Plan of Care  Patient       Patient will benefit from skilled therapeutic intervention in order to improve the following deficits and impairments:  Abnormal gait, Postural dysfunction, Decreased mobility, Decreased activity tolerance, Decreased endurance, Decreased strength, Difficulty walking, Decreased balance, Cardiopulmonary status limiting activity  Visit Diagnosis: Other abnormalities of gait and mobility  Unsteadiness on feet  Muscle weakness (generalized)     Problem List There are no active problems to display for this patient.     Laureen Abrahams, PT, DPT 07/24/18 4:05 PM     [facility] [place], [place], [address] Phone: [phone]   Fax:  [phone]  Name: Johnny Latu MRN: [id number] Date of Birth: 24-Oct-1950

## 2018-07-30 ENCOUNTER — Encounter: Payer: Medicare Other | Admitting: Physical Therapy

## 2018-08-04 ENCOUNTER — Encounter: Payer: Medicare Other | Admitting: Physical Therapy

## 2018-08-05 ENCOUNTER — Telehealth: Payer: Self-pay | Admitting: Physical Therapy

## 2018-08-05 NOTE — Telephone Encounter (Signed)
Left message for pt due to no show.  No more scheduled appts at this time.  Clarita Crane, PT, DPT 08/05/18 8:02 AM

## 2018-08-28 ENCOUNTER — Ambulatory Visit (INDEPENDENT_AMBULATORY_CARE_PROVIDER_SITE_OTHER): Payer: Medicare Other | Admitting: Physical Therapy

## 2018-08-28 ENCOUNTER — Encounter: Payer: Self-pay | Admitting: Physical Therapy

## 2018-08-28 DIAGNOSIS — R2689 Other abnormalities of gait and mobility: Secondary | ICD-10-CM | POA: Diagnosis present

## 2018-08-28 DIAGNOSIS — R2681 Unsteadiness on feet: Secondary | ICD-10-CM | POA: Diagnosis not present

## 2018-08-28 DIAGNOSIS — M6281 Muscle weakness (generalized): Secondary | ICD-10-CM | POA: Diagnosis not present

## 2018-08-28 NOTE — Therapy (Signed)
[place], [place], [address] Phone: [phone]   Fax:  [phone]  Physical Therapy Treatment/Discharge/Recert  Patient Details  Name: Brittney Cain MRN: [id number] Date of Birth: 12-10-1950 Referring Provider (PT): Chipper Oman, MD   Encounter Date: 08/28/2018  PT End of Session - 08/28/18 1525    Visit Number  8    Number of Visits  9    Date for PT Re-Evaluation  08/07/18   extended due to missed weeks   Authorization Type  Medicare    PT Start Time  7616    PT Stop Time  1526    PT Time Calculation (min)  30 min    Activity Tolerance  Patient limited by fatigue;Patient tolerated treatment well    Behavior During Therapy  [facility] for tasks assessed/performed       History reviewed. No pertinent past medical history.  History reviewed. No pertinent surgical history.  There were no vitals filed for this visit.  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    Patient Stated Goals  balance    Pain Onset  More than a month ago         [facility] PT Assessment - 08/28/18 1459      Assessment   Medical Diagnosis  Progressive MS    Referring Provider (PT)  Arvella Nigh, Meta Hatchet, MD      Ambulation/Gait   Gait velocity  1.34 ft/sec   13.41 sec = 18'     Berg Balance Test   Sit to Stand  Able to stand without using hands and stabilize independently    Standing Unsupported  Able to stand safely 2 minutes    Sitting with Back Unsupported but Feet Supported on Floor or Stool  Able to sit safely and securely 2 minutes    Stand to Sit  Sits safely with minimal use of hands    Transfers  Able to transfer safely, minor use of hands    Standing Unsupported with Eyes Closed  Able to stand 10 seconds safely    Standing Ubsupported with Feet Together  Able to  place feet together independently and stand 1 minute safely    From Standing, Reach Forward with Outstretched Arm  Can reach confidently >25 cm (10")    From Standing Position, Pick up Object from Floor  Able to pick up shoe safely and easily    From Standing Position, Turn to Look Behind Over each Shoulder  Looks behind from both sides and weight shifts well    Turn 360 Degrees  Able to turn 360 degrees safely but slowly    Standing Unsupported, Alternately Place Feet on Step/Stool  Able to stand independently and safely and complete 8 steps in 20 seconds    Standing Unsupported, One Foot in Front  Able to plae foot ahead of the other independently and hold 30 seconds    Standing on One Leg  Able to lift leg independently and hold > 10 seconds    Total Score  53      Timed Up and Go Test   Normal TUG (seconds)  16.74                   [facility] Adult PT Treatment/Exercise - 08/28/18 1459  Self-Care   Self-Care  ADL's    ADL's  use of sock aid and long handled shoe horn to help with ADLs    Other Self-Care Comments   discussed PT's role in MS and how to assist and best serve patient.  Pt agreeable to d/c today to continue with HEP and return PRN for PT             PT Education - 08/28/18 1525    Education Details  ADLs and adaptive equipment    Person(s) Educated  Patient    Methods  Explanation    Comprehension  Verbalized understanding       PT Short Term Goals - 08/28/18 1526      PT SHORT TERM GOAL #1   Title  verbalize understanding of community resources available for MS    Status  Achieved      PT SHORT TERM GOAL #2   Title  improve gait velocity to > 2.2 ft/sec for improved mobility    Baseline  slight decrease    Status  Not Met        PT Long Term Goals - 08/28/18 1526      PT LONG TERM GOAL #1   Title  independent with HEP    Status  Achieved      PT LONG TERM GOAL #2   Title  improve gait velocity to >/= 2.62 ft/sec for improved  community access    Status  Not Met      PT LONG TERM GOAL #3   Title  improve BERG balance score to >/= 45/56 for improved balance    Status  Achieved      PT LONG TERM GOAL #4   Title  improve timed up and go to < 14 sec for improved balance and mobility    Status  Not Met            Plan - 08/28/18 1526    Clinical Impression Statement  Pt has met 2/4 LTGs and agreeable to d/c from PT at this time to continue with home exercises and community fitness.    PT Frequency  1x / week    PT Duration  4 weeks    PT Treatment/Interventions  ADLs/Self Care Home Management;Cryotherapy;Electrical Stimulation;Ultrasound;Moist Heat;Gait training;Stair training;Functional mobility training;DME Instruction;Therapeutic activities;Therapeutic exercise;Balance training;Neuromuscular re-education;Patient/family education;Energy conservation;Taping    PT Next Visit Plan  d/c PT    PT Home Exercise Plan  Access Code: [email or web address]    Consulted and Agree with Plan of Care  Patient       Patient will benefit from skilled therapeutic intervention in order to improve the following deficits and impairments:  Abnormal gait, Postural dysfunction, Decreased mobility, Decreased activity tolerance, Decreased endurance, Decreased strength, Difficulty walking, Decreased balance, Cardiopulmonary status limiting activity  Visit Diagnosis: Other abnormalities of gait and mobility - Plan: PT plan of care cert/re-cert  Unsteadiness on feet - Plan: PT plan of care cert/re-cert  Muscle weakness (generalized) - Plan: PT plan of care cert/re-cert     Problem List There are no active problems to display for this patient.     Laureen Abrahams, PT, DPT 08/28/18 3:29 PM    [facility] [place], [place], [address] Phone: [phone]   Fax:  [phone]  Name: Brittney Cain MRN: [id number] Date of Birth: [birth year]  PHYSICAL  THERAPY DISCHARGE SUMMARY  Visits from Start of Care: 8  Current functional level related  to goals / functional outcomes: See above   Remaining deficits: See above   Education / Equipment: HEP  Plan: Patient agrees to discharge.  Patient goals were partially met. Patient is being discharged due to being pleased with the current functional level.  ?????     Laureen Abrahams, PT, DPT 08/28/18 3:29 PM  [facility] [place], [place] [address]  [phone] (office) [phone] (fax)

## 2020-01-05 ENCOUNTER — Other Ambulatory Visit (HOSPITAL_COMMUNITY): Payer: Medicare Other

## 2020-01-05 ENCOUNTER — Inpatient Hospital Stay
Admission: RE | Admit: 2020-01-05 | Discharge: 2020-01-14 | Disposition: A | Discharge status: Rehabilitation Facility | Payer: Medicare Other | Source: Other Acute Inpatient Hospital | Attending: Internal Medicine | Admitting: Internal Medicine

## 2020-01-05 DIAGNOSIS — Z4659 Encounter for fitting and adjustment of other gastrointestinal appliance and device: Secondary | ICD-10-CM

## 2020-01-05 DIAGNOSIS — T797XXD Traumatic subcutaneous emphysema, subsequent encounter: Secondary | ICD-10-CM

## 2020-01-05 DIAGNOSIS — J939 Pneumothorax, unspecified: Secondary | ICD-10-CM

## 2020-01-06 ENCOUNTER — Other Ambulatory Visit (HOSPITAL_COMMUNITY): Payer: Medicare Other

## 2020-01-06 LAB — COMPREHENSIVE METABOLIC PANEL
ALT: 36 U/L (ref 0–44)
AST: 29 U/L (ref 15–41)
Albumin: 2.8 g/dL — ABNORMAL LOW (ref 3.5–5.0)
Alkaline Phosphatase: 108 U/L (ref 38–126)
Anion gap: 8 (ref 5–15)
BUN: 17 mg/dL (ref 8–23)
CO2: 33 mmol/L — ABNORMAL HIGH (ref 22–32)
Calcium: 8.8 mg/dL — ABNORMAL LOW (ref 8.9–10.3)
Chloride: 97 mmol/L — ABNORMAL LOW (ref 98–111)
Creatinine, Ser: 0.64 mg/dL (ref 0.44–1.00)
GFR calc Af Amer: >60 mL/min (ref >60)
GFR calc non Af Amer: >60 mL/min (ref >60)
Glucose, Bld: 115 mg/dL — ABNORMAL HIGH (ref 70–99)
Potassium: 3.7 mmol/L (ref 3.5–5.1)
Sodium: 138 mmol/L (ref 135–145)
Total Bilirubin: 0.5 mg/dL (ref 0.3–1.2)
Total Protein: 6 g/dL — ABNORMAL LOW (ref 6.5–8.1)

## 2020-01-06 LAB — CBC
HCT: 25.6 % — ABNORMAL LOW (ref 36.0–46.0)
Hemoglobin: 8 g/dL — ABNORMAL LOW (ref 12.0–15.0)
MCH: 31.3 pg (ref 26.0–34.0)
MCHC: 31.3 g/dL (ref 30.0–36.0)
MCV: 100 fL (ref 80.0–100.0)
Platelets: 476 10*3/uL — ABNORMAL HIGH (ref 150–400)
RBC: 2.56 MIL/uL — ABNORMAL LOW (ref 3.87–5.11)
RDW: 18.1 % — ABNORMAL HIGH (ref 11.5–15.5)
WBC: 11.4 10*3/uL — ABNORMAL HIGH (ref 4.0–10.5)
nRBC: 0 % (ref 0.0–0.2)

## 2020-01-06 LAB — URINALYSIS, ROUTINE W REFLEX MICROSCOPIC
Bacteria, UA: NONE SEEN
Bilirubin Urine: NEGATIVE
Glucose, UA: NEGATIVE mg/dL
Ketones, ur: NEGATIVE mg/dL
Leukocytes,Ua: NEGATIVE
Nitrite: NEGATIVE
Protein, ur: NEGATIVE mg/dL
Specific Gravity, Urine: 1.011 (ref 1.005–1.030)
pH: 8 (ref 5.0–8.0)

## 2020-01-06 LAB — TSH: TSH: 1.359 u[IU]/mL (ref 0.350–4.500)

## 2020-01-06 LAB — BLOOD GAS, ARTERIAL
Acid-Base Excess: 8.2 mmol/L — ABNORMAL HIGH (ref 0.0–2.0)
Bicarbonate: 33.7 mmol/L — ABNORMAL HIGH (ref 20.0–28.0)
FIO2: 28
O2 Saturation: 92.7 %
Patient temperature: 36.6
pCO2 arterial: 60.2 mmHg — ABNORMAL HIGH (ref 32.0–48.0)
pH, Arterial: 7.364 (ref 7.350–7.450)
pO2, Arterial: 65.9 mmHg — ABNORMAL LOW (ref 83.0–108.0)

## 2020-01-07 ENCOUNTER — Other Ambulatory Visit (HOSPITAL_COMMUNITY): Payer: Medicare Other

## 2020-01-07 LAB — PHOSPHORUS: Phosphorus: 3.5 mg/dL (ref 2.5–4.6)

## 2020-01-07 LAB — VITAMIN B12: Vitamin B-12: 675 pg/mL (ref 180–914)

## 2020-01-07 LAB — FOLATE: Folate: 13.7 ng/mL (ref >5.9)

## 2020-01-07 LAB — MAGNESIUM: Magnesium: 2.2 mg/dL (ref 1.7–2.4)

## 2020-01-08 ENCOUNTER — Other Ambulatory Visit (HOSPITAL_COMMUNITY): Payer: Medicare Other

## 2020-01-09 ENCOUNTER — Other Ambulatory Visit (HOSPITAL_COMMUNITY): Payer: Medicare Other

## 2020-01-11 LAB — BASIC METABOLIC PANEL
Anion gap: 9 (ref 5–15)
BUN: 20 mg/dL (ref 8–23)
CO2: 31 mmol/L (ref 22–32)
Calcium: 8.6 mg/dL — ABNORMAL LOW (ref 8.9–10.3)
Chloride: 97 mmol/L — ABNORMAL LOW (ref 98–111)
Creatinine, Ser: 0.76 mg/dL (ref 0.44–1.00)
GFR calc Af Amer: >60 mL/min (ref >60)
GFR calc non Af Amer: >60 mL/min (ref >60)
Glucose, Bld: 145 mg/dL — ABNORMAL HIGH (ref 70–99)
Potassium: 3.6 mmol/L (ref 3.5–5.1)
Sodium: 137 mmol/L (ref 135–145)

## 2020-01-11 LAB — MAGNESIUM: Magnesium: 1.9 mg/dL (ref 1.7–2.4)

## 2020-01-11 LAB — CBC
HCT: 24 % — ABNORMAL LOW (ref 36.0–46.0)
Hemoglobin: 7.5 g/dL — ABNORMAL LOW (ref 12.0–15.0)
MCH: 31.8 pg (ref 26.0–34.0)
MCHC: 31.3 g/dL (ref 30.0–36.0)
MCV: 101.7 fL — ABNORMAL HIGH (ref 80.0–100.0)
Platelets: 481 10*3/uL — ABNORMAL HIGH (ref 150–400)
RBC: 2.36 MIL/uL — ABNORMAL LOW (ref 3.87–5.11)
RDW: 18.6 % — ABNORMAL HIGH (ref 11.5–15.5)
WBC: 11.7 10*3/uL — ABNORMAL HIGH (ref 4.0–10.5)
nRBC: 0 % (ref 0.0–0.2)

## 2020-01-13 LAB — CBC
HCT: 26.1 % — ABNORMAL LOW (ref 36.0–46.0)
Hemoglobin: 8 g/dL — ABNORMAL LOW (ref 12.0–15.0)
MCH: 31.1 pg (ref 26.0–34.0)
MCHC: 30.7 g/dL (ref 30.0–36.0)
MCV: 101.6 fL — ABNORMAL HIGH (ref 80.0–100.0)
Platelets: 466 10*3/uL — ABNORMAL HIGH (ref 150–400)
RBC: 2.57 MIL/uL — ABNORMAL LOW (ref 3.87–5.11)
RDW: 17.3 % — ABNORMAL HIGH (ref 11.5–15.5)
WBC: 9.8 10*3/uL (ref 4.0–10.5)
nRBC: 0 % (ref 0.0–0.2)

## 2020-01-13 LAB — MAGNESIUM: Magnesium: 2.1 mg/dL (ref 1.7–2.4)

## 2020-01-13 LAB — BASIC METABOLIC PANEL
Anion gap: 9 (ref 5–15)
BUN: 22 mg/dL (ref 8–23)
CO2: 34 mmol/L — ABNORMAL HIGH (ref 22–32)
Calcium: 9.3 mg/dL (ref 8.9–10.3)
Chloride: 92 mmol/L — ABNORMAL LOW (ref 98–111)
Creatinine, Ser: 0.74 mg/dL (ref 0.44–1.00)
GFR calc Af Amer: >60 mL/min (ref >60)
GFR calc non Af Amer: >60 mL/min (ref >60)
Glucose, Bld: 81 mg/dL (ref 70–99)
Potassium: 4.3 mmol/L (ref 3.5–5.1)
Sodium: 135 mmol/L (ref 135–145)

## 2020-01-14 LAB — SARS CORONAVIRUS 2 (TAT 6-24 HRS): SARS Coronavirus 2: NEGATIVE

## 2021-08-01 IMAGING — DX DG CHEST 1V PORT
1 series · 1 of 1 positions shown · non-contrast
Comparison: Abdominal radiograph dated 01/08/2020.

CLINICAL DATA: 68-year-old female with air in the soft tissue of
abdomen.

EXAM:
PORTABLE ABDOMEN - 1 VIEW; PORTABLE CHEST - 1 VIEW

[chest]
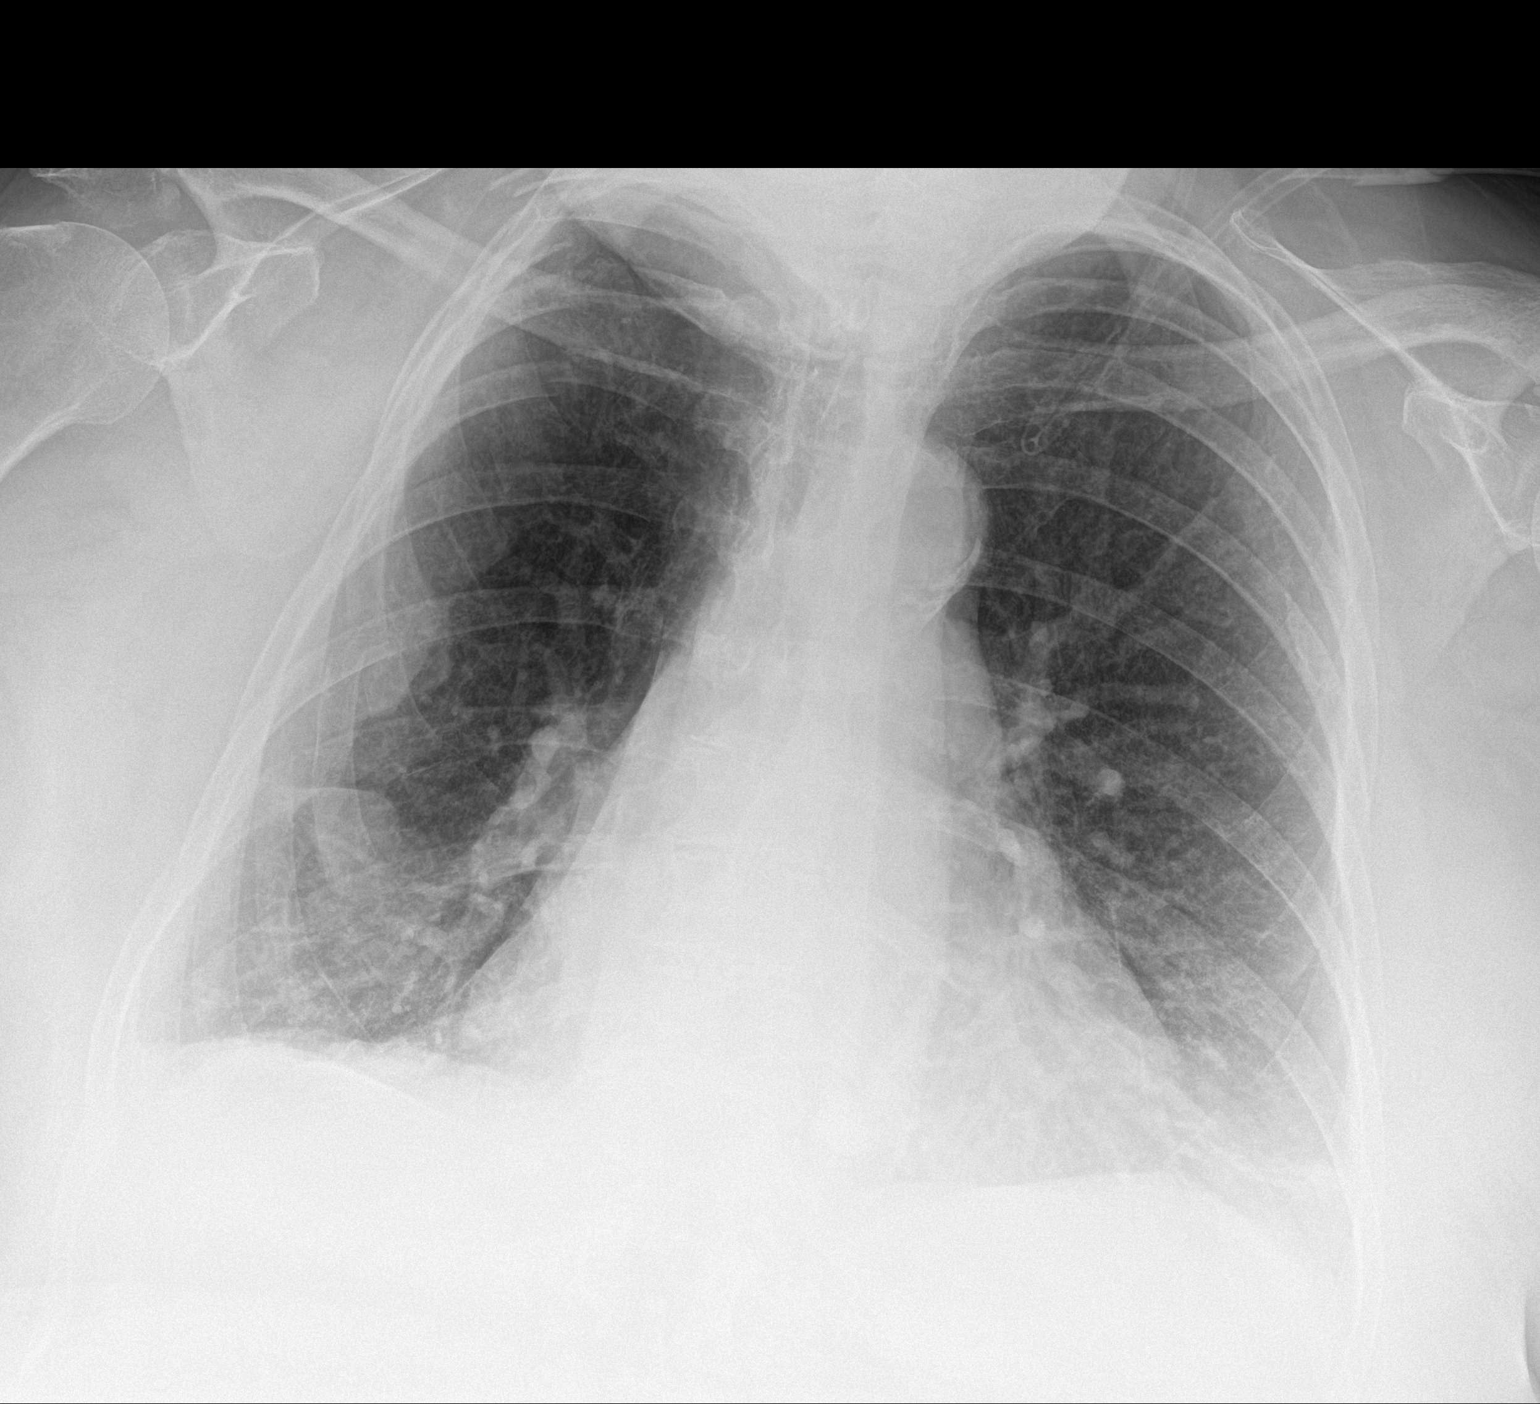

[1 of 1 positions shown; findings below may reference images not displayed]

FINDINGS: There are bibasilar linear atelectasis/scarring. No new
consolidative changes. Faint density along the lateral right lung
similar to prior radiograph likely represent pleural thickening or
pleural effusion or hemothorax secondary to rib fractures. Several
mildly displaced rib fractures noted. No identifiable pneumothorax.

Evaluation of the abdomen is very limited on the single provided
image. No significant soft tissue gas identified.
IMPRESSION: 1. Multiple mildly displaced rib fractures. No identifiable
pneumothorax.
2. Small right pleural effusion and bibasilar atelectasis/scarring.
3. No significant soft tissue gas.

## 2021-08-09 DEATH — deceased
